# Patient Record
Sex: Male | Born: 1995 | Race: White | Hispanic: No | Marital: Single | Smoking: Never smoker
Health system: Southern US, Community
[De-identification: ages and names within clinical notes are randomized; demographics above are authoritative.]

---

## 2018-03-22 ENCOUNTER — Emergency Department: Payer: BLUE CROSS/BLUE SHIELD

## 2018-03-22 ENCOUNTER — Other Ambulatory Visit: Payer: Self-pay

## 2018-03-22 ENCOUNTER — Emergency Department
Admission: EM | Admit: 2018-03-22 | Discharge: 2018-03-22 | Disposition: A | Discharge status: Home / Self Care | Payer: BLUE CROSS/BLUE SHIELD | Attending: Emergency Medicine | Admitting: Emergency Medicine

## 2018-03-22 DIAGNOSIS — R110 Nausea: Secondary | ICD-10-CM | POA: Diagnosis not present

## 2018-03-22 DIAGNOSIS — J189 Pneumonia, unspecified organism: Secondary | ICD-10-CM | POA: Insufficient documentation

## 2018-03-22 DIAGNOSIS — J181 Lobar pneumonia, unspecified organism: Secondary | ICD-10-CM

## 2018-03-22 DIAGNOSIS — R197 Diarrhea, unspecified: Secondary | ICD-10-CM | POA: Diagnosis not present

## 2018-03-22 DIAGNOSIS — R509 Fever, unspecified: Secondary | ICD-10-CM | POA: Diagnosis present

## 2018-03-22 LAB — COMPREHENSIVE METABOLIC PANEL
ALBUMIN: 3.9 g/dL (ref 3.5–5.0)
ALT: 13 U/L (ref 0–44)
AST: 24 U/L (ref 15–41)
Alkaline Phosphatase: 37 U/L — ABNORMAL LOW (ref 38–126)
Anion gap: 10 (ref 5–15)
BUN: 8 mg/dL (ref 6–20)
CHLORIDE: 101 mmol/L (ref 98–111)
CO2: 24 mmol/L (ref 22–32)
Calcium: 9 mg/dL (ref 8.9–10.3)
Creatinine, Ser: 0.74 mg/dL (ref 0.61–1.24)
GFR calc Af Amer: >60 mL/min (ref >60)
GFR calc non Af Amer: >60 mL/min (ref >60)
GLUCOSE: 123 mg/dL — AB (ref 70–99)
POTASSIUM: 3.7 mmol/L (ref 3.5–5.1)
SODIUM: 135 mmol/L (ref 135–145)
Total Bilirubin: 0.8 mg/dL (ref 0.3–1.2)
Total Protein: 7.4 g/dL (ref 6.5–8.1)

## 2018-03-22 LAB — URINALYSIS, COMPLETE (UACMP) WITH MICROSCOPIC
BACTERIA UA: NONE SEEN
Bilirubin Urine: NEGATIVE
Glucose, UA: NEGATIVE mg/dL
Hgb urine dipstick: NEGATIVE
Ketones, ur: 5 mg/dL — AB
Leukocytes, UA: NEGATIVE
Nitrite: NEGATIVE
Protein, ur: 30 mg/dL — AB
SPECIFIC GRAVITY, URINE: 1.017 (ref 1.005–1.030)
pH: 8 (ref 5.0–8.0)

## 2018-03-22 LAB — CBC WITH DIFFERENTIAL/PLATELET
Basophils Absolute: 0 10*3/uL (ref 0–0.1)
Basophils Relative: 0 %
Eosinophils Absolute: 0.1 10*3/uL (ref 0–0.7)
Eosinophils Relative: 1 %
HEMATOCRIT: 42.2 % (ref 40.0–52.0)
HEMOGLOBIN: 15 g/dL (ref 13.0–18.0)
LYMPHS PCT: 12 %
Lymphs Abs: 1.3 10*3/uL (ref 1.0–3.6)
MCH: 31.2 pg (ref 26.0–34.0)
MCHC: 35.5 g/dL (ref 32.0–36.0)
MCV: 88.1 fL (ref 80.0–100.0)
MONO ABS: 1.3 10*3/uL — AB (ref 0.2–1.0)
MONOS PCT: 12 %
NEUTROS ABS: 8.1 10*3/uL — AB (ref 1.4–6.5)
Neutrophils Relative %: 75 %
Platelets: 234 10*3/uL (ref 150–440)
RBC: 4.79 MIL/uL (ref 4.40–5.90)
RDW: 12.9 % (ref 11.5–14.5)
WBC: 10.8 10*3/uL — ABNORMAL HIGH (ref 3.8–10.6)

## 2018-03-22 LAB — LACTIC ACID, PLASMA: LACTIC ACID, VENOUS: 1.6 mmol/L (ref 0.5–1.9)

## 2018-03-22 MED ORDER — AZITHROMYCIN 250 MG PO TABS: ORAL_TABLET | ORAL | 0 refills | Status: AC

## 2018-03-22 MED ORDER — ACETAMINOPHEN 500 MG PO TABS
ORAL_TABLET | ORAL | Status: AC
  Filled 2018-03-22: qty 2

## 2018-03-22 MED ORDER — CEPHALEXIN 500 MG PO CAPS
500.0000 mg | ORAL_CAPSULE | Freq: Once | ORAL | Status: AC
  Administered 2018-03-22: 500 mg via ORAL
  Filled 2018-03-22: qty 1

## 2018-03-22 MED ORDER — ACETAMINOPHEN 500 MG PO TABS
1000.0000 mg | ORAL_TABLET | Freq: Once | ORAL | Status: AC
  Administered 2018-03-22: 1000 mg via ORAL

## 2018-03-22 MED ORDER — CEPHALEXIN 500 MG PO CAPS: 500.0000 mg | ORAL_CAPSULE | Freq: Three times a day (TID) | ORAL | 0 refills | Status: AC

## 2018-03-22 MED ORDER — AZITHROMYCIN 500 MG PO TABS
500.0000 mg | ORAL_TABLET | Freq: Once | ORAL | Status: AC
  Administered 2018-03-22: 500 mg via ORAL
  Filled 2018-03-22: qty 1

## 2018-03-22 NOTE — Discharge Instructions (Addendum)
Please follow-up with student health in 2 to 3 days for recheck/reevaluation.  Take your antibiotics as prescribed.  Use Tylenol or ibuprofen every 6 hours as needed for fever/discomfort as written on the box.  Return to the emergency department for any trouble breathing, significant chest pain, or any other symptom personally concerning to yourself.

## 2018-03-22 NOTE — ED Triage Notes (Signed)
Pt states fever from Sunday to Tuesday. Seen at [facility] and told URI. Diarrhea since last night. States weakness from not being able to eat anything.     Alert, oriented, ambulatory.

## 2018-03-22 NOTE — ED Provider Notes (Signed)
[facility] Emergency Department Provider Note  Time seen: 5:24 PM  I have reviewed the triage vital signs and the nursing notes.   HISTORY  Chief Complaint Fever and Diarrhea    HPI Victor Brady is a 22 y.o. male with no significant past medical history presents to the emergency department for cough and fever now developed diarrhea.  According to the patient for the past 1 week he has been experiencing upper respiratory infection intermittent subjective fever, states weakness and not being able to eat due to nausea and lack of appetite.  Patient also states he has been expensing diarrhea since last night.  Patient went to an urgent care several days ago and was told he likely had an upper respiratory infection.  Patient denies any chest pain or abdominal pain.  Denies any shortness of breath, does continue to have an occasional cough but states his cough has improved since earlier in the week.  Patient noted to be febrile to 102.9 upon arrival.   History reviewed. No pertinent past medical history.  There are no active problems to display for this patient.   History reviewed. No pertinent surgical history.  Prior to Admission medications   Not on File    Not on File  History reviewed. No pertinent family history.  Social History Social History   Tobacco Use  . Smoking status: Never Smoker  Substance Use Topics  . Alcohol use: Never    Frequency: Never  . Drug use: Not on file    Review of Systems Constitutional: Positive for fever. Eyes: Negative for visual complaints ENT: Congestion over the past 1 week Cardiovascular: Negative for chest pain. Respiratory: Negative for shortness of breath.  Positive for cough x1 week Gastrointestinal: Negative for abdominal pain, vomiting.  Positive for nausea.  Positive for diarrhea. Genitourinary: Negative for urinary compaints Musculoskeletal: Negative for musculoskeletal complaints Skin: Negative for  skin complaints  Neurological: Negative for headache All other ROS negative  ____________________________________________   PHYSICAL EXAM:  VITAL SIGNS: ED Triage Vitals [03/22/18 1524]  Enc Vitals Group     BP 118/69     Pulse Rate (!) 101     Resp 20     Temp (!) 102.9 F (39.4 C)     Temp Source Oral     SpO2 98 %     Weight 169 lb (76.7 kg)     Height 5\' 11"  (1.803 m)     Head Circumference      Peak Flow      Pain Score 7     Pain Loc      Pain Edu?      Excl. in GC?    Constitutional: Alert and oriented. Well appearing and in no distress. Eyes: Normal exam ENT   Head: Normocephalic and atraumatic.   Mouth/Throat: Mucous membranes are moist. Cardiovascular: Normal rate, regular rhythm. No murmur Respiratory: Normal respiratory effort without tachypnea nor retractions. Breath sounds are clear  Gastrointestinal: Soft and nontender. No distention.  Musculoskeletal: Nontender with normal range of motion in all extremities. No lower extremity tenderness or edema. Neurologic:  Normal speech and language. No gross focal neurologic deficits Skin:  Skin is warm, dry and intact.  Psychiatric: Mood and affect are normal.  ____________________________________________    RADIOLOGY  Chest x-ray is consistent with right upper lobe pneumonia. ____________________________________________   INITIAL IMPRESSION / ASSESSMENT AND PLAN / ED COURSE  Pertinent labs & imaging results that were available during my care of  the patient were reviewed by me and considered in my medical decision making (see chart for details).  Patient presents to the emergency department for cough and fever.  Differential would include URI, pneumonia, influenza.  Patient's chest x-ray is consistent with pneumonia in the right upper lobe, labs are largely nonrevealing lactate of 1.6 white count of 10.8.  Overall the patient appears very well currently satting in the upper 90s on room air, heart rate  in the 80s.  I offered to place an IV and dose antibiotics and fluids, patient states he is feeling better at this time and would prefer to take his medications orally.  I did discuss follow-up with student health.  Patient agreeable to plan of care.  ____________________________________________   FINAL CLINICAL IMPRESSION(S) / ED DIAGNOSES  Community-acquired pneumonia    Minna Antis, MD 03/22/18 1727

## 2019-11-16 IMAGING — CR DG CHEST 2V
2 series · 2 of 2 positions shown · non-contrast
Comparison: None.

CLINICAL DATA: Fever with cough and shortness of breath

EXAM:
CHEST - 2 VIEW

[chest pa]
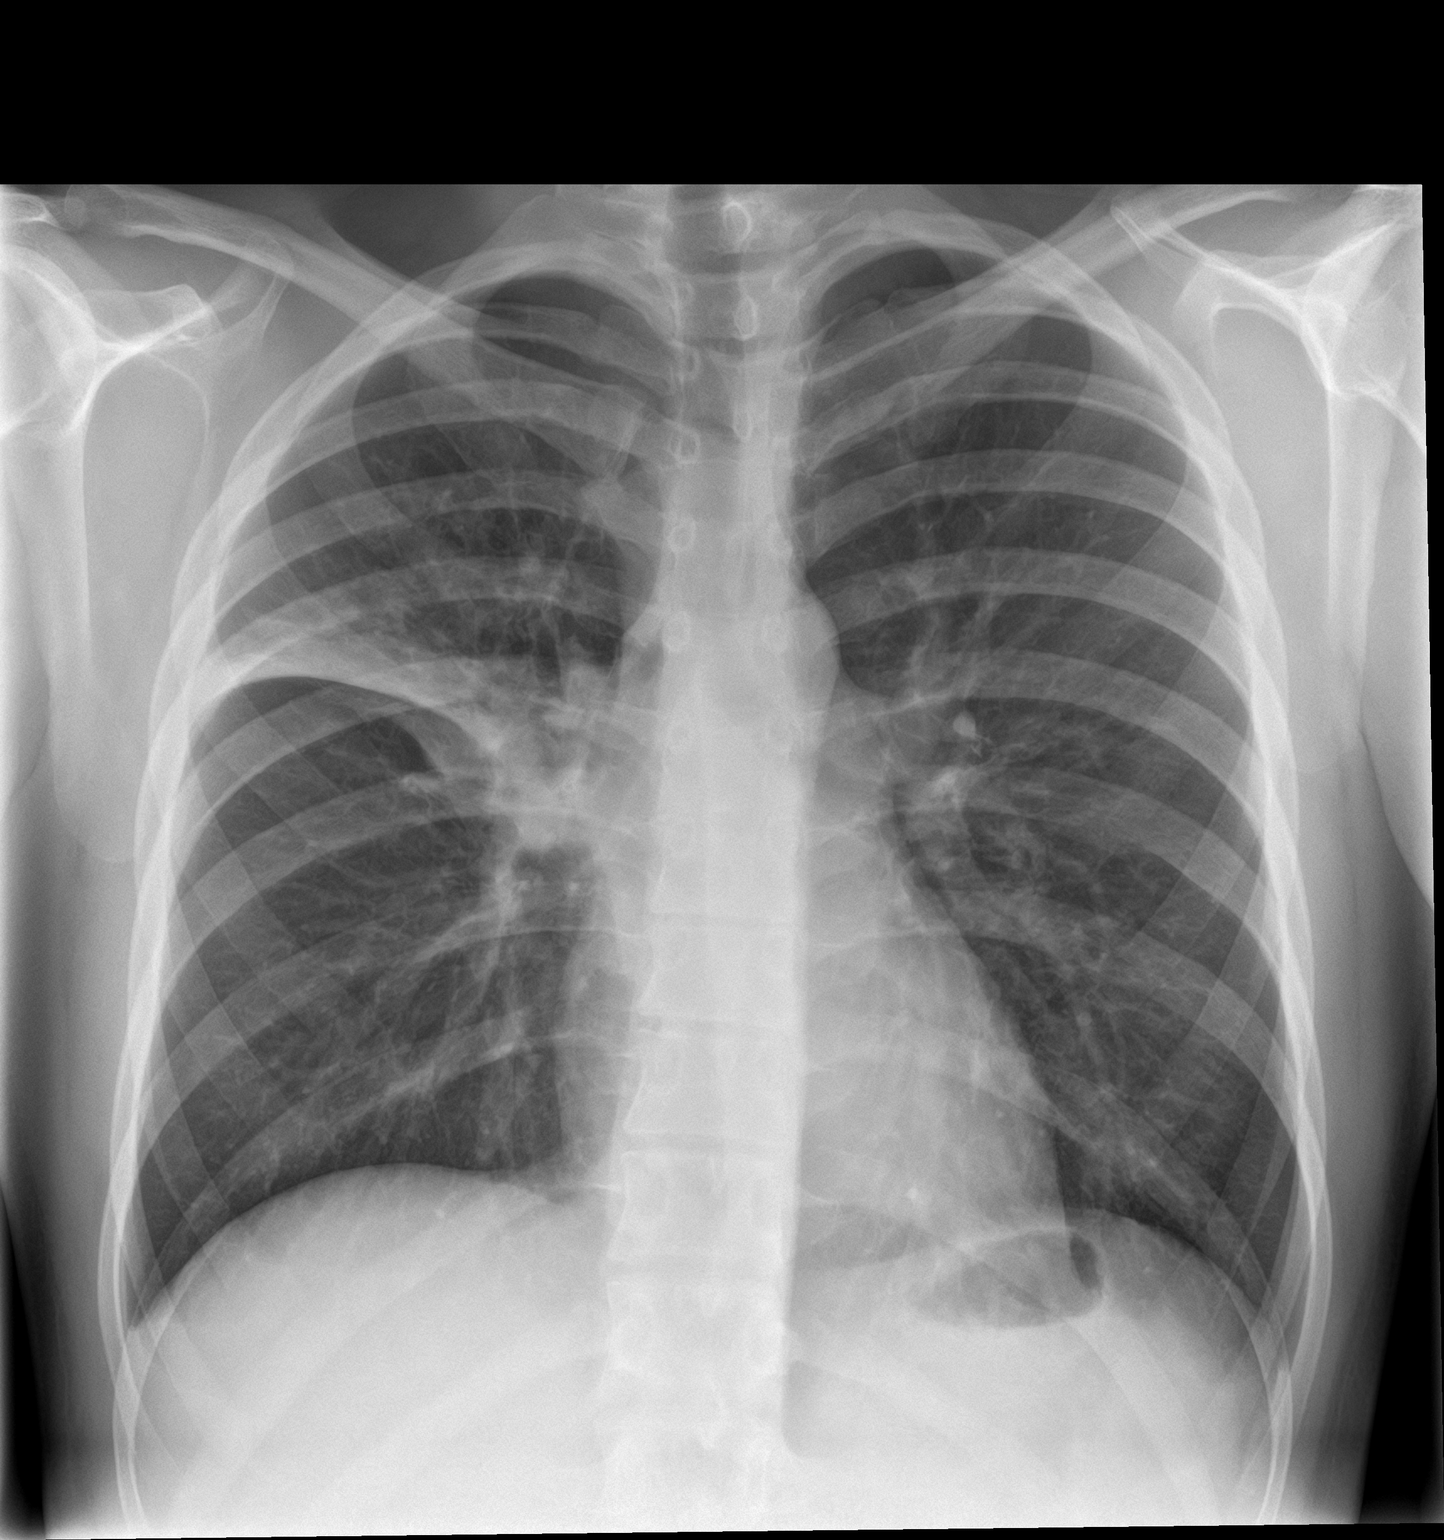

[chest lat]
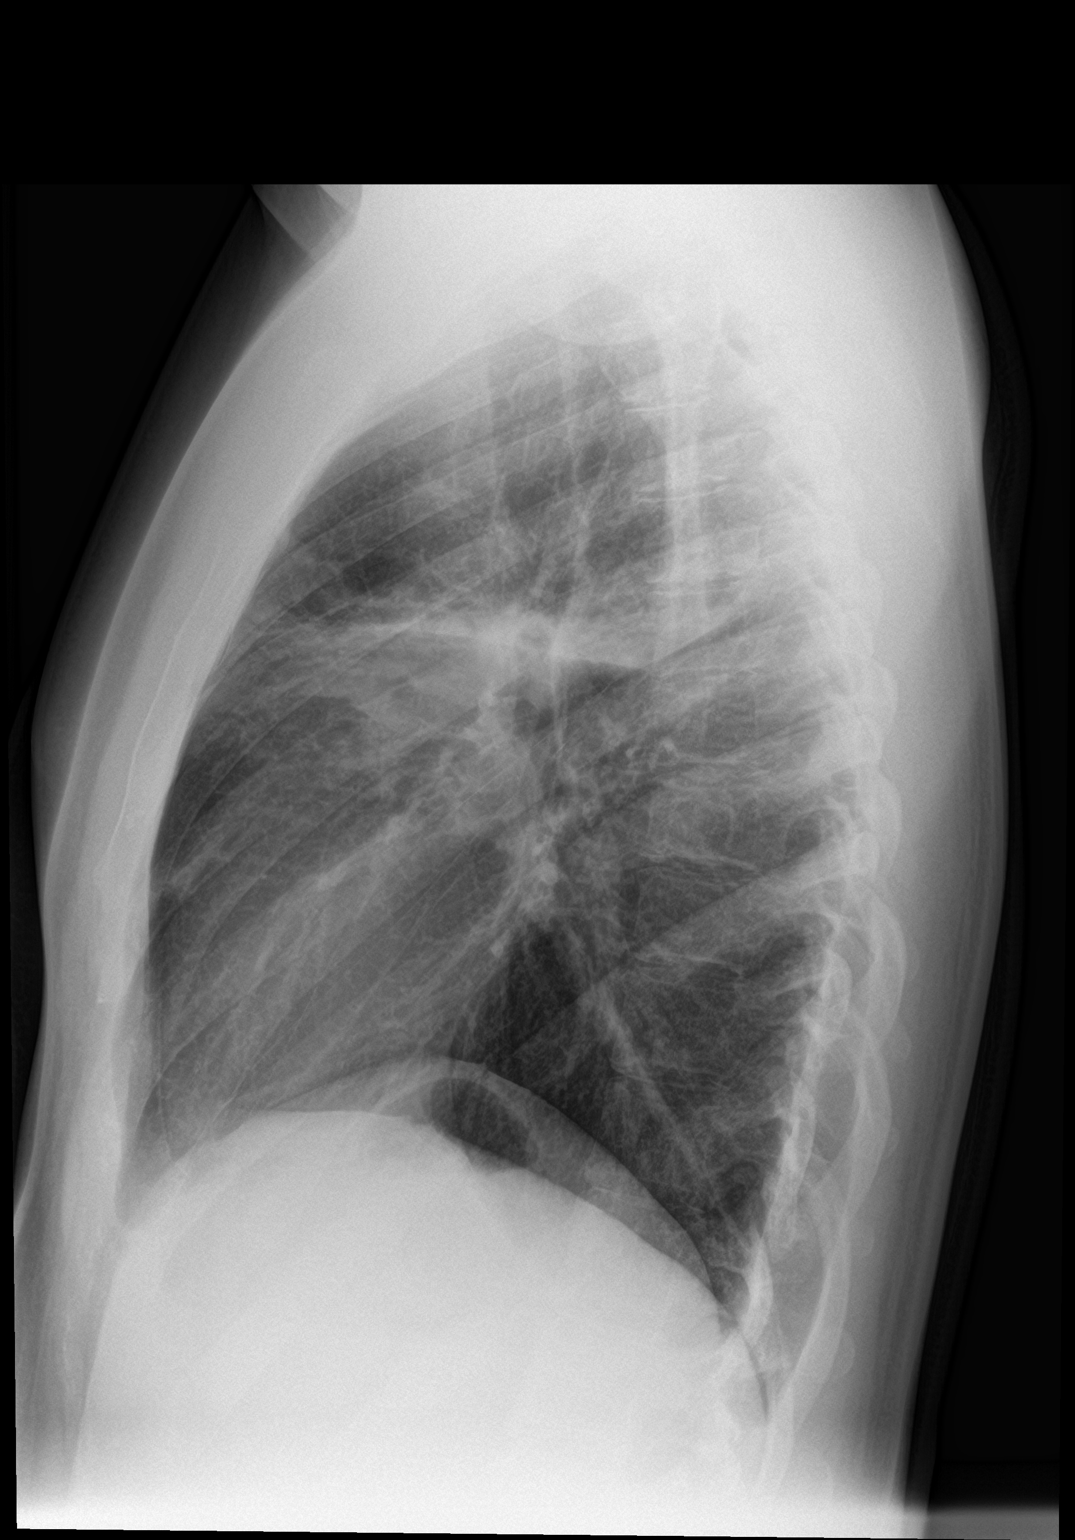

[2 of 2 positions shown; findings below may reference images not displayed]

FINDINGS: There is airspace consolidation in the anterior segment of the right
upper lobe. Lungs elsewhere are clear. Heart size and pulmonary
vascularity are normal. No adenopathy. No bone lesions.
IMPRESSION: Consolidation consistent with pneumonia involving a portion of the
anterior segment right upper lobe. Lungs elsewhere clear. No evident
adenopathy.
